# Patient Record
Sex: Male | Born: 1970 | Race: Black or African American | Hispanic: No | Marital: Married | Smoking: Current some day smoker
Health system: Southern US, Community
[De-identification: ages and names within clinical notes are randomized; demographics above are authoritative.]

## PROBLEM LIST (undated history)

## (undated) DIAGNOSIS — K649 Unspecified hemorrhoids: Secondary | ICD-10-CM

## (undated) DIAGNOSIS — F419 Anxiety disorder, unspecified: Secondary | ICD-10-CM

## (undated) DIAGNOSIS — K219 Gastro-esophageal reflux disease without esophagitis: Secondary | ICD-10-CM

## (undated) DIAGNOSIS — K5900 Constipation, unspecified: Secondary | ICD-10-CM

## (undated) HISTORY — PX: OTHER SURGICAL HISTORY: SHX169

## (undated) HISTORY — DX: Constipation, unspecified: K59.00

---

## 2006-03-06 ENCOUNTER — Encounter: Admission: RE | Admit: 2006-03-06 | Discharge: 2006-03-06 | Payer: Self-pay | Admitting: Occupational Medicine

## 2007-12-07 ENCOUNTER — Ambulatory Visit (HOSPITAL_COMMUNITY): Admission: RE | Admit: 2007-12-07 | Discharge: 2007-12-07 | Payer: Self-pay | Admitting: Internal Medicine

## 2013-07-09 ENCOUNTER — Ambulatory Visit: Payer: Self-pay | Admitting: General Practice

## 2013-07-10 ENCOUNTER — Ambulatory Visit (INDEPENDENT_AMBULATORY_CARE_PROVIDER_SITE_OTHER): Payer: BC Managed Care – PPO | Admitting: Nurse Practitioner

## 2013-07-10 ENCOUNTER — Encounter: Payer: Self-pay | Admitting: Nurse Practitioner

## 2013-07-10 VITALS — BP 137/75 | HR 84 | Temp 97.6°F | Ht 72.0 in | Wt 197.0 lb

## 2013-07-10 DIAGNOSIS — M7912 Myalgia of auxiliary muscles, head and neck: Secondary | ICD-10-CM

## 2013-07-10 DIAGNOSIS — IMO0001 Reserved for inherently not codable concepts without codable children: Secondary | ICD-10-CM

## 2013-07-10 MED ORDER — NAPROXEN 500 MG PO TABS: 500.0000 mg | ORAL_TABLET | Freq: Two times a day (BID) | ORAL | Status: DC

## 2013-07-10 NOTE — Patient Instructions (Signed)

## 2013-07-10 NOTE — Progress Notes (Signed)
   Subjective:    Patient ID: Glenn Coleman, male    DOB: [birth year], 43 y.o.   MRN: [id number]  Shoulder Pain  The pain is present in the left shoulder. This is a new problem. The current episode started 1 to 4 weeks ago. There has been no history of extremity trauma. The problem occurs intermittently. The problem has been unchanged. The quality of the pain is described as dull. The pain is at a severity of 6/10. The pain is moderate. Pertinent negatives include no fever, inability to bear weight, itching, joint locking, joint swelling, limited range of motion, numbness, stiffness or tingling. The symptoms are aggravated by activity (movement makes it worse). He has tried NSAIDS and acetaminophen for the symptoms. The treatment provided mild relief. Family history does not include gout or rheumatoid arthritis.      Review of Systems  Constitutional: Negative for fever.  Musculoskeletal: Negative for stiffness.  Skin: Negative for itching.  Neurological: Negative for tingling and numbness.  All other systems reviewed and are negative.       Objective:   Physical Exam  Constitutional: He appears well-developed and well-nourished.  Cardiovascular: Normal rate, regular rhythm and normal heart sounds.   Pulmonary/Chest: Effort normal and breath sounds normal.  Musculoskeletal:  Decreased ROM of cervical spine due to pain on extension- no pain on rotation Point tenderness along sternocleidomastoid muscle FROM of left shoulder without pain Grips equal bil DTR's equal bil   Neurological: He has normal reflexes.  Psychiatric: He has a normal mood and affect. His behavior is normal. Judgment and thought content normal.    BP 137/75  Pulse 84  Temp(Src) 97.6 F (36.4 C) (Oral)  Ht 6' (1.829 m)  Wt 197 lb (89.359 kg)  BMI 26.71 kg/m2       Assessment & Plan:   1. Sternocleidomastoid muscle tenderness    Meds ordered this encounter  Medications  . naproxen (NAPROSYN) 500 MG  tablet    Sig: Take 1 tablet (500 mg total) by mouth 2 (two) times daily with a meal.    Dispense:  60 tablet    Refill:  0    Order Specific Question:  Supervising Provider    Answer:  Chipper Herb [[id number]]   Moist heat rst No heavy lifting RTO prn  Mary-Margaret Hassell Done, FNP

## 2013-10-17 ENCOUNTER — Telehealth: Payer: Self-pay | Admitting: Nurse Practitioner

## 2013-10-17 NOTE — Telephone Encounter (Signed)
appt made for friday

## 2013-10-18 ENCOUNTER — Ambulatory Visit (INDEPENDENT_AMBULATORY_CARE_PROVIDER_SITE_OTHER): Payer: BC Managed Care – PPO | Admitting: Family Medicine

## 2013-10-18 ENCOUNTER — Encounter: Payer: Self-pay | Admitting: Family Medicine

## 2013-10-18 VITALS — BP 129/77 | HR 88 | Temp 98.9°F | Ht 72.0 in | Wt 199.2 lb

## 2013-10-18 DIAGNOSIS — K5900 Constipation, unspecified: Secondary | ICD-10-CM

## 2013-10-18 DIAGNOSIS — K649 Unspecified hemorrhoids: Secondary | ICD-10-CM

## 2013-10-18 MED ORDER — HYDROCORTISONE ACETATE 25 MG RE SUPP: 25.0000 mg | Freq: Two times a day (BID) | RECTAL | Status: AC

## 2013-10-18 NOTE — Progress Notes (Signed)
   Subjective:    Patient ID: KENDRIK MCSHAN, male    DOB: 1971-01-16, 43 y.o.   MRN: [id number]  HPI This 43 y.o. male presents for evaluation of constipation and blood on stool for a day. C/o rectal pruritis  Review of Systems C/o rectal pruritis and constipation   No chest pain, SOB, HA, dizziness, vision change, N/V, diarrhea, constipation, dysuria, urinary urgency or frequency, myalgias, arthralgias or rash.  Objective:   Physical Exam Vital signs noted  Well developed well nourished male.  HEENT - Head atraumatic Normocephalic                Eyes - PERRLA, Conjuctiva - clear Sclera- Clear EOMI Respiratory - Lungs CTA bilateral Cardiac - RRR S1 and S2 without murmur GI - Abdomen soft Nontender and bowel sounds active x 4 Extremities - No edema. Neuro - Grossly intact.       Assessment & Plan:  Unspecified constipation - Plan: hydrocortisone (ANUSOL-HC) 25 MG suppository  Hemorrhoids - Plan: hydrocortisone (ANUSOL-HC) 25 MG suppository  Discussed getting plenty of fluid and fiber in diet  Lysbeth Penner FNP

## 2013-10-24 ENCOUNTER — Ambulatory Visit (INDEPENDENT_AMBULATORY_CARE_PROVIDER_SITE_OTHER): Payer: BC Managed Care – PPO | Admitting: Nurse Practitioner

## 2013-10-24 VITALS — BP 135/75 | HR 71 | Temp 98.7°F | Ht 72.0 in | Wt 200.0 lb

## 2013-10-24 DIAGNOSIS — K5900 Constipation, unspecified: Secondary | ICD-10-CM

## 2013-10-24 DIAGNOSIS — K625 Hemorrhage of anus and rectum: Secondary | ICD-10-CM

## 2013-10-24 DIAGNOSIS — K644 Residual hemorrhoidal skin tags: Secondary | ICD-10-CM

## 2013-10-24 NOTE — Progress Notes (Signed)
   Subjective:    Patient ID: Glenn Coleman, male    DOB: 1971/04/21, 43 y.o.   MRN: [id number]  HPI Patient was seen Friday in office with c/o constipation- was given anusol-HC- Patient used them but is now having bright red blood. Denies rectal pain.    Review of Systems  Constitutional: Negative.   HENT: Negative.   Respiratory: Negative.   Cardiovascular: Negative.   Genitourinary: Negative.   Psychiatric/Behavioral: Negative.   All other systems reviewed and are negative.      Objective:   Physical Exam  Constitutional: He is oriented to person, place, and time. He appears well-developed and well-nourished.  Cardiovascular: Normal rate, regular rhythm and normal heart sounds.   Pulmonary/Chest: Effort normal and breath sounds normal.  Abdominal: Soft. Bowel sounds are normal.  Genitourinary:  Rectal exam revealed a mildly thrombosed external hemorrhoid. No internal hemorrhoids palpable.  Neurological: He is alert and oriented to person, place, and time.  Skin: Skin is warm and dry.  Psychiatric: He has a normal mood and affect. His behavior is normal. Judgment and thought content normal.  BP 135/75  Pulse 71  Temp(Src) 98.7 F (37.1 C) (Oral)  Ht 6' (1.829 m)  Wt 200 lb (90.719 kg)  BMI 27.12 kg/m2         Assessment & Plan:  1. Rectal bleeding Keep watch  2. External bleeding hemorrhoids Use anusol HC supp as rx  3. Constipation Increase fiber in diet Drink lots of liquids miralax OTC as needed  [place], FNP

## 2013-10-24 NOTE — Patient Instructions (Signed)

## 2014-10-04 ENCOUNTER — Encounter (HOSPITAL_COMMUNITY): Payer: Self-pay | Admitting: Emergency Medicine

## 2014-10-04 ENCOUNTER — Emergency Department (HOSPITAL_COMMUNITY)
Admission: EM | Admit: 2014-10-04 | Discharge: 2014-10-04 | Disposition: A | Discharge status: Home / Self Care | Payer: BLUE CROSS/BLUE SHIELD | Attending: Emergency Medicine | Admitting: Emergency Medicine

## 2014-10-04 DIAGNOSIS — Z7952 Long term (current) use of systemic steroids: Secondary | ICD-10-CM | POA: Insufficient documentation

## 2014-10-04 DIAGNOSIS — Z720 Tobacco use: Secondary | ICD-10-CM | POA: Diagnosis not present

## 2014-10-04 DIAGNOSIS — K5900 Constipation, unspecified: Secondary | ICD-10-CM | POA: Diagnosis not present

## 2014-10-04 DIAGNOSIS — K921 Melena: Secondary | ICD-10-CM | POA: Insufficient documentation

## 2014-10-04 DIAGNOSIS — K625 Hemorrhage of anus and rectum: Secondary | ICD-10-CM | POA: Diagnosis present

## 2014-10-04 DIAGNOSIS — K648 Other hemorrhoids: Secondary | ICD-10-CM | POA: Insufficient documentation

## 2014-10-04 HISTORY — DX: Unspecified hemorrhoids: K64.9

## 2014-10-04 LAB — CBC
HCT: 45.5 % (ref 39.0–52.0)
Hemoglobin: 15.3 g/dL (ref 13.0–17.0)
MCH: 27.9 pg (ref 26.0–34.0)
MCHC: 33.6 g/dL (ref 30.0–36.0)
MCV: 82.9 fL (ref 78.0–100.0)
PLATELETS: 233 10*3/uL (ref 150–400)
RBC: 5.49 MIL/uL (ref 4.22–5.81)
RDW: 14.4 % (ref 11.5–15.5)
WBC: 7.7 10*3/uL (ref 4.0–10.5)

## 2014-10-04 LAB — COMPREHENSIVE METABOLIC PANEL
ALBUMIN: 4.2 g/dL (ref 3.5–5.2)
ALT: 28 U/L (ref 0–53)
AST: 26 U/L (ref 0–37)
Alkaline Phosphatase: 62 U/L (ref 39–117)
Anion gap: 9 (ref 5–15)
BUN: 16 mg/dL (ref 6–23)
CALCIUM: 9.2 mg/dL (ref 8.4–10.5)
CO2: 25 mmol/L (ref 19–32)
Chloride: 106 mmol/L (ref 96–112)
Creatinine, Ser: 1.15 mg/dL (ref 0.50–1.35)
GFR calc Af Amer: 89 mL/min — ABNORMAL LOW (ref >90)
GFR, EST NON AFRICAN AMERICAN: 77 mL/min — AB (ref >90)
GLUCOSE: 132 mg/dL — AB (ref 70–99)
Potassium: 3.7 mmol/L (ref 3.5–5.1)
Sodium: 140 mmol/L (ref 135–145)
Total Bilirubin: 0.6 mg/dL (ref 0.3–1.2)
Total Protein: 8.1 g/dL (ref 6.0–8.3)

## 2014-10-04 LAB — POC OCCULT BLOOD, ED: FECAL OCCULT BLD: POSITIVE — AB

## 2014-10-04 NOTE — Discharge Instructions (Signed)
Follow up with dr. Sydell Axon in 1-2 weeks return if problems

## 2014-10-04 NOTE — ED Notes (Signed)
Pt reports bright red rectal bleeding for last several days. Pt denies any dizziness,abd pain or weakness.pt reports history of hemorrhoids and constipation. Pt reports has been using rectal suppositories at home. Last NBM x1 week.

## 2014-10-04 NOTE — ED Provider Notes (Signed)
CSN: [id number]     Arrival date & time 10/04/14  1937 History  This chart was scribed for Milton Ferguson, MD by Erling Conte, ED Scribe. This patient was seen in room APA08/AP[facility]08 and the patient's care was started at 9:15 AM.    Chief Complaint  Patient presents with  . Rectal Bleeding    Patient is a 44 y.o. male presenting with hematochezia. The history is provided by the patient. No language interpreter was used.  Rectal Bleeding Quality:  Bright red Amount:  Moderate Duration:  2 days Timing:  Intermittent Progression:  Unchanged Chronicity:  Recurrent Context: constipation and hemorrhoids   Similar prior episodes: yes   Relieved by:  Nothing Ineffective treatments: suppositories. Associated symptoms: no abdominal pain, no fever and no vomiting      HPI Comments: CHRLES SELLEY is a 44 y.o. male who presents to the Emergency Department complaining of bright red rectal bleeding that began 2 days ago.Pt has a h/o hemorrhoids and has had this issue in the past. Pt notes before this recent episode of hematochezia he had been having mild constipation. He has been using hemorrhoid suppositories with minimal relief. He has not followed up with a GI specialist for this issue. He denies any rectal pain, fever, chills,nausea, vomiting, or abdominal pain.  Past Medical History  Diagnosis Date  . Hemorrhoids    History reviewed. No pertinent past surgical history. History reviewed. No pertinent family history. History  Substance Use Topics  . Smoking status: Heavy Tobacco Smoker -- 0.00 packs/day  . Smokeless tobacco: Not on file  . Alcohol Use: Yes     Comment: occasional    Review of Systems  Constitutional: Negative for fever, appetite change and fatigue.  HENT: Negative for congestion, ear discharge and sinus pressure.   Eyes: Negative for discharge.  Respiratory: Negative for cough.   Cardiovascular: Negative for chest pain.  Gastrointestinal: Positive for constipation  (prior to episode), blood in stool and hematochezia. Negative for vomiting, abdominal pain, diarrhea and rectal pain.  Genitourinary: Negative for frequency and hematuria.  Musculoskeletal: Negative for back pain.  Skin: Negative for rash.  Neurological: Negative for seizures and headaches.  Psychiatric/Behavioral: Negative for hallucinations.      Allergies  Review of patient's allergies indicates no known allergies.  Home Medications   Prior to Admission medications   Medication Sig Start Date End Date Taking? Authorizing Provider  hydrocortisone (ANUSOL-HC) 25 MG suppository Place 1 suppository (25 mg total) rectally 2 (two) times daily. 10/18/13   Lysbeth Penner, FNP   Triage Vitals: BP 155/96 mmHg  Pulse 105  Temp(Src) 98.4 F (36.9 C) (Oral)  Resp 18  Ht 6' (1.829 m)  Wt 200 lb (90.719 kg)  BMI 27.12 kg/m2  SpO2 98%   Physical Exam  Constitutional: He is oriented to person, place, and time. He appears well-developed.  HENT:  Head: Normocephalic.  Eyes: Conjunctivae and EOM are normal. No scleral icterus.  Neck: Neck supple. No thyromegaly present.  Cardiovascular: Normal rate and regular rhythm.  Exam reveals no gallop and no friction rub.   No murmur heard. Pulmonary/Chest: No stridor. He has no wheezes. He has no rales. He exhibits no tenderness.  Abdominal: He exhibits no distension. There is no tenderness. There is no rebound.  Genitourinary:  1 external hemorrhoid, non thrombosed. Otherwise normal rectal exam. Heme positive  Musculoskeletal: Normal range of motion. He exhibits no edema.  Lymphadenopathy:    He has no cervical adenopathy.  Neurological:  He is oriented to person, place, and time. He exhibits normal muscle tone. Coordination normal.  Skin: No rash noted. No erythema.  Psychiatric: He has a normal mood and affect. His behavior is normal.    ED Course  Procedures (including critical care time)  DIAGNOSTIC STUDIES: Oxygen Saturation is 98%  on RA, normal by my interpretation.    COORDINATION OF CARE: 9:23 AM- Will order CBC, CMP, and orthostatic vital signs.  Pt advised of plan for treatment and pt agrees.   Labs Review Labs Reviewed  POC OCCULT BLOOD, ED - Abnormal; Notable for the following:    Fecal Occult Bld POSITIVE (*)    All other components within normal limits  CBC  COMPREHENSIVE METABOLIC PANEL    Imaging Review No results found.   EKG Interpretation None      MDM   Final diagnoses:  None    External hemorrhoid.  Follow up with gi   The chart was scribed for me under my direct supervision.  I personally performed the history, physical, and medical decision making and all procedures in the evaluation of this patient.Milton Ferguson, MD 10/04/14 [phone]

## 2014-10-04 NOTE — ED Notes (Signed)
Hemoccult performed by MD.  Positive results.

## 2014-10-09 ENCOUNTER — Encounter: Payer: Self-pay | Admitting: Gastroenterology

## 2014-10-15 ENCOUNTER — Ambulatory Visit: Payer: BLUE CROSS/BLUE SHIELD | Admitting: Nurse Practitioner

## 2014-10-15 ENCOUNTER — Encounter: Payer: Self-pay | Admitting: Nurse Practitioner

## 2014-10-15 ENCOUNTER — Ambulatory Visit (INDEPENDENT_AMBULATORY_CARE_PROVIDER_SITE_OTHER): Payer: BLUE CROSS/BLUE SHIELD | Admitting: Nurse Practitioner

## 2014-10-15 VITALS — BP 134/79 | HR 90 | Temp 97.8°F | Ht 72.0 in | Wt 200.0 lb

## 2014-10-15 DIAGNOSIS — K649 Unspecified hemorrhoids: Secondary | ICD-10-CM | POA: Diagnosis not present

## 2014-10-15 NOTE — Progress Notes (Signed)
Primary Care Physician:  Glenda Chroman., MD Primary Gastroenterologist:  Dr.Fields   Chief Complaint  Patient presents with  . Hemorrhoids    HPI:   44 year old male presents on referral from PCP for hemorrhoids. Per the PCP note which was reviewed the patient presents to any pain emergency room for rectal pain and bleeding. Diagnosis was hemorrhoids, continued having bleeding for 2 days after his visit to emergency room. Per her PCP had been noted constipated for few days after his initial bowel movement had significant blood in the stool and continue with intermittent symptoms. External hemorrhoid was noted on exam with mild blood noted on the hemorrhoid no blood in the rectal vault or any black tarry stools. Was given a rectal suppository for hemorrhoids symptomatic treatment and referred to GI. In the emergency room his CBC and CMP were normal with a hemoglobin of 15.9.  Today he states he went to the ER for rectal bleeding after a night of drinking. In the ER he was told he had hemorrhoids. Saw his PCP a couple days later who Rx stool softener and rectal cream, which the pharmacy was unable to fill for a few days. When he did start using thecream his symptoms resolved after a couple days. His symptoms have been resolved for about a week. Denies any more rectal pain, itching, irritation, bleeding. Deneis melena. Denies abdominal pain, N/V. Hasn't taken stool softener in a couple days. Stools remain soft with no needed straining. Denies any other upper or lower GI symptoms.  Past Medical History  Diagnosis Date  . Hemorrhoids     No past surgical history on file.  Current Outpatient Prescriptions  Medication Sig Dispense Refill  . docusate sodium (COLACE) 100 MG capsule Take 100 mg by mouth 2 (two) times daily.    . hydrocortisone (ANUSOL-HC) 25 MG suppository Place 1 suppository (25 mg total) rectally 2 (two) times daily. (Patient not taking: Reported on 10/15/2014) 12 suppository 1    No current facility-administered medications for this visit.    Allergies as of 10/15/2014  . (No Known Allergies)    No family history on file.  History   Social History  . Marital Status: Married    Spouse Name: N/A  . Number of Children: N/A  . Years of Education: N/A   Occupational History  . Not on file.   Social History Main Topics  . Smoking status: Heavy Tobacco Smoker -- 0.00 packs/day  . Smokeless tobacco: Not on file  . Alcohol Use: Yes     Comment: occasional  . Drug Use: No  . Sexual Activity: Not on file   Other Topics Concern  . Not on file   Social History Narrative    Review of Systems: General: Negative for anorexia, weight loss, fever, chills, fatigue, weakness. Eyes: Negative for vision changes.  ENT: Negative for hoarseness, difficulty swallowing. CV: Negative for chest pain, angina, palpitations,  peripheral edema.  Respiratory: Negative for dyspnea at rest, cough, wheezing.  GI: See history of present illness. Derm: Negative for rash or itching.  Neuro: Negative for weakness, seizure, memory loss, confusion.  Psych: Negative for anxiety, depression.  Endo: Negative for unusual weight change.  Heme: Negative for bruising or bleeding. Allergy: Negative for rash or hives.    Physical Exam: BP 134/79 mmHg  Pulse 90  Temp(Src) 97.8 F (36.6 C) (Oral)  Ht 6' (1.829 m)  Wt 200 lb (90.719 kg)  BMI 27.12 kg/m2 General:   Alert and oriented.  Pleasant and cooperative. Well-nourished and well-developed.  Head:  Normocephalic and atraumatic. Eyes:  Without icterus, sclera clear and conjunctiva pink.  Ears:  Normal auditory acuity. Lungs:  Clear to auscultation bilaterally. No wheezes, rales, or rhonchi. No distress.  Heart:  S1, S2 present without murmurs appreciated.  Abdomen:  +BS, soft, non-tender and non-distended. No HSM noted. No guarding or rebound. No masses appreciated.  Rectal:  Deferred  Extremities:  Without clubbing or  edema. Neurologic:  Alert and  oriented x4;  grossly normal neurologically. Skin:  Intact without significant lesions or rashes. Cervical Nodes:  No significant cervical adenopathy. Psych:  Alert and cooperative. Normal mood and affect.     10/15/2014 9:02 AM

## 2014-10-15 NOTE — Assessment & Plan Note (Signed)
44 year old male referred for hemorrhoids and rectal bleeding. His PCP started him on a stool softener as well as Anusol rectal cream. His symptoms have since resolved and has not had any bleeding pain or irritation for over a week. At this point we'll have him return in 3 months once everything is calm down in order to reevaluate his symptoms determine has been any additional bleeding, and check an I follow-up for any blood. Patient has also been explained that based on his risk profile he should have his first screening colonoscopy in 2019 at age 29 because he is African-American. He is agreeable to this and we will place him on the recall list for it. He can also call us before then if he has additional flares of his hemorrhoid symptoms or rectal bleeding for either further prescriptions or to be seen sooner.

## 2014-10-15 NOTE — Patient Instructions (Signed)
1. Call if you have another flare up of your hemorrhoids 2. Return in 3 months so we can check a stool test for any more blood. 3. Return sooner if needed.

## 2014-10-15 NOTE — Progress Notes (Signed)
cc'ed to pcp °

## 2015-01-14 ENCOUNTER — Ambulatory Visit (INDEPENDENT_AMBULATORY_CARE_PROVIDER_SITE_OTHER): Payer: BLUE CROSS/BLUE SHIELD | Admitting: Nurse Practitioner

## 2015-01-14 ENCOUNTER — Encounter: Payer: Self-pay | Admitting: Nurse Practitioner

## 2015-01-14 VITALS — BP 134/79 | HR 86 | Temp 97.7°F | Ht 72.0 in | Wt 199.0 lb

## 2015-01-14 DIAGNOSIS — K5900 Constipation, unspecified: Secondary | ICD-10-CM

## 2015-01-14 DIAGNOSIS — K649 Unspecified hemorrhoids: Secondary | ICD-10-CM

## 2015-01-14 NOTE — Assessment & Plan Note (Signed)
44 year old male currently asymptomatic with his hemorrhoids. He has not noted any additional rectal bleeding. His single episode of rectal bleeding likely related to constipation induced hemorrhoid irritation which is subsequently resolved with Anusol rectal cream. Return for follow-up as needed for any worsening or recurrent symptoms.

## 2015-01-14 NOTE — Progress Notes (Signed)
    Referring Provider: Glenda Chroman., MD Primary Care Physician:  Glenda Chroman., MD Primary GI:  Dr. Oneida Alar  Chief Complaint  Patient presents with  . Follow-up    HPI:   44 year old male presents for follow-up on hemorrhoids and rectal bleeding. He was evaluated in the emergency room before his previous visit at which point to start him on Anusol rectal cream. At his last visit his symptoms had resolved for about a week. We advised him to continue his therapies and return in 3 months to evaluate longer-term effect of treatment. He is due for his first colonoscopy in 2019 and is on the recall list.  Today he states he's doing pretty good. Has not noted any additional rectal bleeding. Has improved bowel movement with increased fiber and water. Does occasionally have a feeling of constipation and needing to strain which occurs about 1-2 times a week but is better with increased fiber and diet control. Denies rectal pain, irritation, and itching. If he does have symptoms he will use Anusol cream. Denies abdominal pain. Denies fever, chills, unintentional weight loss. Denies chest pain, dyspnea, dizziness, lightheadedness, syncope, near syncope. Denies any other upper or lower GI symptoms.  Past Medical History  Diagnosis Date  . Hemorrhoids     Past Surgical History  Procedure Laterality Date  . None to date - 10/15/14      Current Outpatient Prescriptions  Medication Sig Dispense Refill  . docusate sodium (COLACE) 100 MG capsule Take 100 mg by mouth 2 (two) times daily.    . hydrocortisone (ANUSOL-HC) 25 MG suppository Place 1 suppository (25 mg total) rectally 2 (two) times daily. 12 suppository 1   No current facility-administered medications for this visit.    Allergies as of 01/14/2015  . (No Known Allergies)    Family History  Problem Relation Age of Onset  . Colon cancer Neg Hx     History   Social History  . Marital Status: Married    Spouse Name: N/A  . Number  of Children: N/A  . Years of Education: N/A   Social History Main Topics  . Smoking status: Current Some Day Smoker -- 0.00 packs/day    Types: Cigars  . Smokeless tobacco: Not on file     Comment: Occasional cigar  . Alcohol Use: 0.0 oz/week    0 Standard drinks or equivalent per week     Comment: Every weekend, about 3-5 drinks  . Drug Use: No  . Sexual Activity: Not on file   Other Topics Concern  . None   Social History Narrative    Review of Systems: 10 point ROS negative except as per HPI.   Physical Exam: BP 134/79 mmHg  Pulse 86  Temp(Src) 97.7 F (36.5 C)  Ht 6' (1.829 m)  Wt 199 lb (90.266 kg)  BMI 26.98 kg/m2 General:   Alert and oriented. Pleasant and cooperative. Well-nourished and well-developed.  Head:  Normocephalic and atraumatic. Cardiovascular:  S1, S2 present without murmurs appreciated. Normal pulses noted. Extremities without clubbing or edema. Respiratory:  Clear to auscultation bilaterally. No wheezes, rales, or rhonchi. No distress.  Gastrointestinal:  +BS, soft, non-tender and non-distended. No HSM noted. No guarding or rebound. No masses appreciated.  Neurologic:  Alert and oriented x4;  grossly normal neurologically. Psych:  Alert and cooperative. Normal mood and affect.    01/14/2015 2:10 PM

## 2015-01-14 NOTE — Progress Notes (Signed)
CC'ED TO PCP 

## 2015-01-14 NOTE — Assessment & Plan Note (Signed)
44 year old male with symptoms of constipation. He states his symptoms are improved with increased fiber intake, fruits, and vegetables. However he still has one to 2 episodes a week of difficulty emptying his bowels with increased straining required. No rectal bleeding noted. No red flag/warning signs or symptoms. At this point we will advise him to start Colace stool softener once daily as well as Benefiber fiber supplementation daily. Return as needed for any recurrent or worsening symptoms.

## 2015-01-14 NOTE — Patient Instructions (Signed)
1. Chart taking a daily stool softener such as Colace once a day. 2. You can also add fiber supplement to your diet such as Benefiber 2 teaspoons once daily. 3. Return for further evaluation if any worsening symptoms, recurrent rectal bleeding. 4. We have you on a recall list for your first do screening colonoscopy and we will send you a letter when it is time to have that done.

## 2015-01-15 NOTE — Progress Notes (Signed)
CC'ED TO PCP 

## 2015-04-23 ENCOUNTER — Ambulatory Visit (INDEPENDENT_AMBULATORY_CARE_PROVIDER_SITE_OTHER): Payer: BLUE CROSS/BLUE SHIELD | Admitting: *Deleted

## 2015-04-23 DIAGNOSIS — Z23 Encounter for immunization: Secondary | ICD-10-CM

## 2017-08-17 ENCOUNTER — Encounter: Payer: Self-pay | Admitting: Gastroenterology

## 2017-08-31 ENCOUNTER — Ambulatory Visit: Payer: BLUE CROSS/BLUE SHIELD | Admitting: Nurse Practitioner

## 2017-08-31 ENCOUNTER — Other Ambulatory Visit: Payer: Self-pay

## 2017-08-31 ENCOUNTER — Encounter: Payer: Self-pay | Admitting: Nurse Practitioner

## 2017-08-31 ENCOUNTER — Telehealth: Payer: Self-pay

## 2017-08-31 VITALS — BP 144/84 | HR 79 | Temp 98.5°F | Ht 72.0 in | Wt 190.6 lb

## 2017-08-31 DIAGNOSIS — K649 Unspecified hemorrhoids: Secondary | ICD-10-CM | POA: Diagnosis not present

## 2017-08-31 DIAGNOSIS — K625 Hemorrhage of anus and rectum: Secondary | ICD-10-CM | POA: Diagnosis not present

## 2017-08-31 DIAGNOSIS — K5900 Constipation, unspecified: Secondary | ICD-10-CM

## 2017-08-31 MED ORDER — PEG 3350-KCL-NA BICARB-NACL 420 G PO SOLR: 4000.0000 mL | ORAL | 0 refills | Status: DC

## 2017-08-31 NOTE — Telephone Encounter (Signed)
Called and informed pt of pre-op appt 10/17/17 at 9:00am. Letter mailed.

## 2017-08-31 NOTE — Progress Notes (Addendum)
REVIEWED-NO ADDITIONAL RECOMMENDATIONS.  Referring Provider: Glenda Chroman, MD Primary Care Physician:  Glenda Chroman, MD Primary GI:  Dr. Oneida Alar  Chief Complaint  Patient presents with  . Hemorrhoids    itching/burning/some bright red blood with BM    HPI:   Glenn Coleman is a 47 y.o. male who presents for follow-up on hemorrhoids.  The patient was last seen in our office 01/14/2015 also for hemorrhoids and constipation.  Last visit he was doing well, no additional rectal bleeding and improved bowel movement with increased fiber and water.  Occasional feeling of constipation and straining about 1-2 times a week but is improved with increased fiber in diet  Rare hemorrhoid symptoms which improved with Anusol.  No other GI symptoms.  Noted be due for first-ever colonoscopy in 2019 and is on the recall list.  Recommended daily stool softener such as Colace, add fiber supplement, return for any worsening symptoms.  Recently saw primary care 08/16/2017 for recurrent hemorrhoids.  At that time he noted he felt he was seeing a non-constipating diet, worried it could be "something else".  Symptoms included anal pain and itching, constipation.  Onset was 2 weeks ago.  Today he states his hemorrhoids have been bothering him intermittently "for some time." Still has Anusol and using that which helps some. Started seeing rectal bleeding over the last year. Most recently had bleeding yesterday with dripping blood, blood on the stool and in the commode; bleeding intermittent. Has straining and feels like it's the hemorrhoids; stools are hard, however. Taking stool softeners usually help. Takes stool softener once daily. Denies abdominal pain, N/V, melena, fever, chills, unintentional weight loss. Denies chest pain, dyspnea, dizziness, lightheadedness, syncope, near syncope. Denies any other upper or lower GI symptoms.  Past Medical History:  Diagnosis Date  . Constipation   . Hemorrhoids     Past  Surgical History:  Procedure Laterality Date  . None to date     As of 08/31/17    Current Outpatient Medications  Medication Sig Dispense Refill  . docusate sodium (COLACE) 100 MG capsule Take 100 mg by mouth 2 (two) times daily.    . hydrocortisone (ANUSOL-HC) 25 MG suppository Place 1 suppository (25 mg total) rectally 2 (two) times daily. 12 suppository 1   No current facility-administered medications for this visit.     Allergies as of 08/31/2017  . (No Known Allergies)    Family History  Problem Relation Age of Onset  . Colon cancer Neg Hx     Social History   Socioeconomic History  . Marital status: Married    Spouse name: None  . Number of children: None  . Years of education: None  . Highest education level: None  Social Needs  . Financial resource strain: None  . Food insecurity - worry: None  . Food insecurity - inability: None  . Transportation needs - medical: None  . Transportation needs - non-medical: None  Occupational History  . None  Tobacco Use  . Smoking status: Current Some Day Smoker    Packs/day: 0.00    Types: Cigars  . Smokeless tobacco: Never Used  . Tobacco comment: 1 cigar  Substance and Sexual Activity  . Alcohol use: Yes    Alcohol/week: 0.0 oz    Comment: Every weekend, about 3-5 drinks  . Drug use: No  . Sexual activity: None  Other Topics Concern  . None  Social History Narrative  . None    Review of  Systems: Complete ROS negative except as per HPI.   Physical Exam: BP (!) 144/84   Pulse 79   Temp 98.5 F (36.9 C) (Oral)   Ht 6' (1.829 m)   Wt 190 lb 9.6 oz (86.5 kg)   BMI 25.85 kg/m  General:   Alert and oriented. Pleasant and cooperative. Well-nourished and well-developed.  Head:  Normocephalic and atraumatic. Eyes:  Without icterus, sclera clear and conjunctiva pink.  Ears:  Normal auditory acuity. Cardiovascular:  S1, S2 present without murmurs appreciated. Extremities without clubbing or edema. Respiratory:   Clear to auscultation bilaterally. No wheezes, rales, or rhonchi. No distress.  Gastrointestinal:  +BS, soft, non-tender and non-distended. No HSM noted. No guarding or rebound. No masses appreciated.  Rectal:  Deferred  Musculoskalatal:  Symmetrical without gross deformities. Neurologic:  Alert and oriented x4;  grossly normal neurologically. Psych:  Alert and cooperative. Normal mood and affect. Heme/Lymph/Immune: No excessive bruising noted.    08/31/2017 10:10 AM   Disclaimer: This note was dictated with voice recognition software. Similar sounding words can inadvertently be transcribed and may not be corrected upon review.

## 2017-08-31 NOTE — Assessment & Plan Note (Addendum)
History of intermittent rectal bleeding due to hemorrhoids.  His bleeding stopped in 2016.  He states that it started recurring within the past year.  Known history of hemorrhoids with hemorrhoid symptoms as per below.  He is African-American 47 year old male and would be due for first-ever colonoscopy this year.  Rectal bleeding is intermittent, occasionally moderate to significant amount.  No symptoms of overt anemia.  At this point we will proceed with a colonoscopy to further evaluate.  He is also interested and possible hemorrhoid banding, if he is a candidate.  Follow-up in 3 months.  Proceed with colonoscopy +/- hemorrhoid banding with Dr. Oneida Alar in the near future. The risks, benefits, and alternatives have been discussed in detail with the patient. They state understanding and desire to proceed.   The patient is not on any anticoagulants, anxiolytics, chronic pain medications, or antidepressants.  Patient is large in stature/muscular.  He drinks 3-4 alcoholic drinks on the weekends.  Given chronic alcohol use, relatively young age, large stature we will plan for propofol/MAC to promote adequate sedation.

## 2017-08-31 NOTE — Assessment & Plan Note (Addendum)
History of known hemorrhoids which are symptomatic for him including irritation, rectal fullness, pain.  Also with rectal bleeding.  He is due for colonoscopy and we will proceed as per below.  Continue Anusol.  At this point recommend trying to control constipation as much as possible with his hemorrhoids.  Increase water intake, continue Colace.  Add MiraLAX 1-2 times a day as needed for softer stools.  Follow-up in 3 months.

## 2017-08-31 NOTE — Assessment & Plan Note (Signed)
Constipation likely worsening his hemorrhoid symptoms.  Continue Colace, increase water intake, MiraLAX 1-2 times a day as needed.  Follow-up in 3 months.  Consider hemorrhoid banding, per patient request.

## 2017-08-31 NOTE — Progress Notes (Signed)
cc'ed to pcp °

## 2017-08-31 NOTE — Patient Instructions (Signed)
1. Continue taking Anusol for your hemorrhoids. 2. Continue taking Colace daily. 3. Increase the amount of water you drink daily. 4. You can use MiraLAX 1-2 times a day.  Mix 1 dose (1 capful) and a beverage of your choice.  Do this to help with softer stools, as needed. 5. We will schedule your colonoscopy for you. 6. Follow-up in 3 months. 7. Call us if you have any questions or concerns.

## 2017-09-04 ENCOUNTER — Telehealth: Payer: Self-pay

## 2017-09-04 NOTE — Telephone Encounter (Signed)
Called [employer]. No PA needed for TCS/-/+hem banding. Ref# [id number].

## 2017-09-21 ENCOUNTER — Ambulatory Visit: Payer: BLUE CROSS/BLUE SHIELD | Admitting: Gastroenterology

## 2017-10-12 NOTE — Patient Instructions (Signed)
Glenn Coleman  10/12/2017     @PREFPERIOPPHARMACY @   Your procedure is scheduled on  10/24/2017   Report to [facility] at  1145   A.M.  Call this number if you have problems the morning of surgery:  [phone]   Remember:  Do not eat food or drink liquids after midnight.  Take these medicines the morning of surgery with A SIP OF WATER  nexium.   Do not wear jewelry, make-up or nail polish.  Do not wear lotions, powders, or perfumes, or deodorant.  Do not shave 48 hours prior to surgery.  Men may shave face and neck.  Do not bring valuables to the hospital.  [facility] is not responsible for any belongings or valuables.  Contacts, dentures or bridgework may not be worn into surgery.  Leave your suitcase in the car.  After surgery it may be brought to your room.  For patients admitted to the hospital, discharge time will be determined by your treatment team.  Patients discharged the day of surgery will not be allowed to drive home.   Name and phone number of your driver:   family Special instructions:  Follow the diet and prep instructions given to you by Dr Nona Dell office.  Please read over the following fact sheets that you were given. Anesthesia Post-op Instructions and Care and Recovery After Surgery       Colonoscopy, Adult A colonoscopy is an exam to look at the large intestine. It is done to check for problems, such as:  Lumps (tumors).  Growths (polyps).  Swelling (inflammation).  Bleeding.  What happens before the procedure? Eating and drinking Follow instructions from your doctor about eating and drinking. These instructions may include:  A few days before the procedure - follow a low-fiber diet. ? Avoid nuts. ? Avoid seeds. ? Avoid dried fruit. ? Avoid raw fruits. ? Avoid vegetables.  1-3 days before the procedure - follow a clear liquid diet. Avoid liquids that have red or purple dye. Drink only clear liquids, such as: ? Clear  broth or bouillon. ? Black coffee or tea. ? Clear juice. ? Clear soft drinks or sports drinks. ? Gelatin dessert. ? Popsicles.  On the day of the procedure - do not eat or drink anything during the 2 hours before the procedure.  Bowel prep If you were prescribed an oral bowel prep:  Take it as told by your doctor. Starting the day before your procedure, you will need to drink a lot of liquid. The liquid will cause you to poop (have bowel movements) until your poop is almost clear or light green.  If your skin or butt gets irritated from diarrhea, you may: ? Wipe the area with wipes that have medicine in them, such as adult wet wipes with aloe and vitamin E. ? Put something on your skin that soothes the area, such as petroleum jelly.  If you throw up (vomit) while drinking the bowel prep, take a break for up to 60 minutes. Then begin the bowel prep again. If you keep throwing up and you cannot take the bowel prep without throwing up, call your doctor.  General instructions  Ask your doctor about changing or stopping your normal medicines. This is important if you take diabetes medicines or blood thinners.  Plan to have someone take you home from the hospital or clinic. What happens during the procedure?  An IV tube may be  put into one of your veins.  You will be given medicine to help you relax (sedative).  To reduce your risk of infection: ? Your doctors will wash their hands. ? Your anal area will be washed with soap.  You will be asked to lie on your side with your knees bent.  Your doctor will get a long, thin, flexible tube ready. The tube will have a camera and a light on the end.  The tube will be put into your anus.  The tube will be gently put into your large intestine.  Air will be delivered into your large intestine to keep it open. You may feel some pressure or cramping.  The camera will be used to take photos.  A small tissue sample may be removed from your  body to be looked at under a microscope (biopsy). If any possible problems are found, the tissue will be sent to a lab for testing.  If small growths are found, your doctor may remove them and have them checked for cancer.  The tube that was put into your anus will be slowly removed. The procedure may vary among doctors and hospitals. What happens after the procedure?  Your doctor will check on you often until the medicines you were given have worn off.  Do not drive for 24 hours after the procedure.  You may have a small amount of blood in your poop.  You may pass gas.  You may have mild cramps or bloating in your belly (abdomen).  It is up to you to get the results of your procedure. Ask your doctor, or the department performing the procedure, when your results will be ready. This information is not intended to replace advice given to you by your health care provider. Make sure you discuss any questions you have with your health care provider. Document Released: 07/16/2010 Document Revised: 04/13/2016 Document Reviewed: 08/25/2015 Elsevier Interactive Patient Education  2017 [employer] Inc[employer]  Colonoscopy, Adult, Care After This sheet gives you information about how to care for yourself after your procedure. Your health care provider may also give you more specific instructions. If you have problems or questions, contact your health care provider. What can I expect after the procedure? After the procedure, it is common to have:  A small amount of blood in your stool for 24 hours after the procedure.  Some gas.  Mild abdominal cramping or bloating.  Follow these instructions at home: General instructions   For the first 24 hours after the procedure: ? Do not drive or use machinery. ? Do not sign important documents. ? Do not drink alcohol. ? Do your regular daily activities at a slower pace than normal. ? Eat soft, easy-to-digest foods. ? Rest often.  Take over-the-counter  or prescription medicines only as told by your health care provider.  It is up to you to get the results of your procedure. Ask your health care provider, or the department performing the procedure, when your results will be ready. Relieving cramping and bloating  Try walking around when you have cramps or feel bloated.  Apply heat to your abdomen as told by your health care provider. Use a heat source that your health care provider recommends, such as a moist heat pack or a heating pad. ? Place a towel between your skin and the heat source. ? Leave the heat on for 20-30 minutes. ? Remove the heat if your skin turns bright red. This is especially important if you are  unable to feel pain, heat, or cold. You may have a greater risk of getting burned. Eating and drinking  Drink enough fluid to keep your urine clear or pale yellow.  Resume your normal diet as instructed by your health care provider. Avoid heavy or fried foods that are hard to digest.  Avoid drinking alcohol for as long as instructed by your health care provider. Contact a health care provider if:  You have blood in your stool 2-3 days after the procedure. Get help right away if:  You have more than a small spotting of blood in your stool.  You pass large blood clots in your stool.  Your abdomen is swollen.  You have nausea or vomiting.  You have a fever.  You have increasing abdominal pain that is not relieved with medicine. This information is not intended to replace advice given to you by your health care provider. Make sure you discuss any questions you have with your health care provider. Document Released: 01/26/2004 Document Revised: 03/07/2016 Document Reviewed: 08/25/2015 Elsevier Interactive Patient Education  2018 [place] Anesthesia is a term that refers to techniques, procedures, and medicines that help a person stay safe and comfortable during a medical procedure.  Monitored anesthesia care, or sedation, is one type of anesthesia. Your anesthesia specialist may recommend sedation if you will be having a procedure that does not require you to be unconscious, such as:  Cataract surgery.  A dental procedure.  A biopsy.  A colonoscopy.  During the procedure, you may receive a medicine to help you relax (sedative). There are three levels of sedation:  Mild sedation. At this level, you may feel awake and relaxed. You will be able to follow directions.  Moderate sedation. At this level, you will be sleepy. You may not remember the procedure.  Deep sedation. At this level, you will be asleep. You will not remember the procedure.  The more medicine you are given, the deeper your level of sedation will be. Depending on how you respond to the procedure, the anesthesia specialist may change your level of sedation or the type of anesthesia to fit your needs. An anesthesia specialist will monitor you closely during the procedure. Let your health care provider know about:  Any allergies you have.  All medicines you are taking, including vitamins, herbs, eye drops, creams, and over-the-counter medicines.  Any use of steroids (by mouth or as a cream).  Any problems you or family members have had with sedatives and anesthetic medicines.  Any blood disorders you have.  Any surgeries you have had.  Any medical conditions you have, such as sleep apnea.  Whether you are pregnant or may be pregnant.  Any use of cigarettes, alcohol, or street drugs. What are the risks? Generally, this is a safe procedure. However, problems may occur, including:  Getting too much medicine (oversedation).  Nausea.  Allergic reaction to medicines.  Trouble breathing. If this happens, a breathing tube may be used to help with breathing. It will be removed when you are awake and breathing on your own.  Heart trouble.  Lung trouble.  Before the procedure Staying  hydrated Follow instructions from your health care provider about hydration, which may include:  Up to 2 hours before the procedure - you may continue to drink clear liquids, such as water, clear fruit juice, black coffee, and plain tea.  Eating and drinking restrictions Follow instructions from your health care provider about eating and drinking, which  may include:  8 hours before the procedure - stop eating heavy meals or foods such as meat, fried foods, or fatty foods.  6 hours before the procedure - stop eating light meals or foods, such as toast or cereal.  6 hours before the procedure - stop drinking milk or drinks that contain milk.  2 hours before the procedure - stop drinking clear liquids.  Medicines Ask your health care provider about:  Changing or stopping your regular medicines. This is especially important if you are taking diabetes medicines or blood thinners.  Taking medicines such as aspirin and ibuprofen. These medicines can thin your blood. Do not take these medicines before your procedure if your health care provider instructs you not to.  Tests and exams  You will have a physical exam.  You may have blood tests done to show: ? How well your kidneys and liver are working. ? How well your blood can clot.  General instructions  Plan to have someone take you home from the hospital or clinic.  If you will be going home right after the procedure, plan to have someone with you for 24 hours.  What happens during the procedure?  Your blood pressure, heart rate, breathing, level of pain and overall condition will be monitored.  An IV tube will be inserted into one of your veins.  Your anesthesia specialist will give you medicines as needed to keep you comfortable during the procedure. This may mean changing the level of sedation.  The procedure will be performed. After the procedure  Your blood pressure, heart rate, breathing rate, and blood oxygen level  will be monitored until the medicines you were given have worn off.  Do not drive for 24 hours if you received a sedative.  You may: ? Feel sleepy, clumsy, or nauseous. ? Feel forgetful about what happened after the procedure. ? Have a sore throat if you had a breathing tube during the procedure. ? Vomit. This information is not intended to replace advice given to you by your health care provider. Make sure you discuss any questions you have with your health care provider. Document Released: 03/09/2005 Document Revised: 11/20/2015 Document Reviewed: 10/04/2015 Elsevier Interactive Patient Education  2018 [place], Care After These instructions provide you with information about caring for yourself after your procedure. Your health care provider may also give you more specific instructions. Your treatment has been planned according to current medical practices, but problems sometimes occur. Call your health care provider if you have any problems or questions after your procedure. What can I expect after the procedure? After your procedure, it is common to:  Feel sleepy for several hours.  Feel clumsy and have poor balance for several hours.  Feel forgetful about what happened after the procedure.  Have poor judgment for several hours.  Feel nauseous or vomit.  Have a sore throat if you had a breathing tube during the procedure.  Follow these instructions at home: For at least 24 hours after the procedure:   Do not: ? Participate in activities in which you could fall or become injured. ? Drive. ? Use heavy machinery. ? Drink alcohol. ? Take sleeping pills or medicines that cause drowsiness. ? Make important decisions or sign legal documents. ? Take care of children on your own.  Rest. Eating and drinking  Follow the diet that is recommended by your health care provider.  If you vomit, drink water, juice, or soup when you can drink  without  vomiting.  Make sure you have little or no nausea before eating solid foods. General instructions  Have a responsible adult stay with you until you are awake and alert.  Take over-the-counter and prescription medicines only as told by your health care provider.  If you smoke, do not smoke without supervision.  Keep all follow-up visits as told by your health care provider. This is important. Contact a health care provider if:  You keep feeling nauseous or you keep vomiting.  You feel light-headed.  You develop a rash.  You have a fever. Get help right away if:  You have trouble breathing. This information is not intended to replace advice given to you by your health care provider. Make sure you discuss any questions you have with your health care provider. Document Released: 10/04/2015 Document Revised: 02/03/2016 Document Reviewed: 10/04/2015 Elsevier Interactive Patient Education  [employer].

## 2017-10-17 ENCOUNTER — Encounter (HOSPITAL_COMMUNITY): Payer: Self-pay

## 2017-10-17 ENCOUNTER — Encounter (HOSPITAL_COMMUNITY)
Admission: RE | Admit: 2017-10-17 | Discharge: 2017-10-17 | Disposition: A | Discharge status: Home / Self Care | Payer: BLUE CROSS/BLUE SHIELD | Source: Ambulatory Visit | Attending: Gastroenterology | Admitting: Gastroenterology

## 2017-10-17 ENCOUNTER — Other Ambulatory Visit: Payer: Self-pay

## 2017-10-17 DIAGNOSIS — Z01812 Encounter for preprocedural laboratory examination: Secondary | ICD-10-CM | POA: Insufficient documentation

## 2017-10-17 HISTORY — DX: Gastro-esophageal reflux disease without esophagitis: K21.9

## 2017-10-17 HISTORY — DX: Anxiety disorder, unspecified: F41.9

## 2017-10-17 LAB — BASIC METABOLIC PANEL
Anion gap: 13 (ref 5–15)
BUN: 13 mg/dL (ref 6–20)
CALCIUM: 9.5 mg/dL (ref 8.9–10.3)
CHLORIDE: 105 mmol/L (ref 101–111)
CO2: 23 mmol/L (ref 22–32)
CREATININE: 1.09 mg/dL (ref 0.61–1.24)
GFR calc non Af Amer: >60 mL/min (ref >60)
Glucose, Bld: 111 mg/dL — ABNORMAL HIGH (ref 65–99)
Potassium: 4 mmol/L (ref 3.5–5.1)
Sodium: 141 mmol/L (ref 135–145)

## 2017-10-17 LAB — CBC
HCT: 46.2 % (ref 39.0–52.0)
Hemoglobin: 15.2 g/dL (ref 13.0–17.0)
MCH: 27.4 pg (ref 26.0–34.0)
MCHC: 32.9 g/dL (ref 30.0–36.0)
MCV: 83.2 fL (ref 78.0–100.0)
Platelets: 225 10*3/uL (ref 150–400)
RBC: 5.55 MIL/uL (ref 4.22–5.81)
RDW: 14.8 % (ref 11.5–15.5)
WBC: 6.2 10*3/uL (ref 4.0–10.5)

## 2017-10-24 ENCOUNTER — Encounter (HOSPITAL_COMMUNITY): Payer: Self-pay | Admitting: *Deleted

## 2017-10-24 ENCOUNTER — Ambulatory Visit (HOSPITAL_COMMUNITY)
Admission: RE | Admit: 2017-10-24 | Discharge: 2017-10-24 | Disposition: A | Discharge status: Home / Self Care | Payer: BLUE CROSS/BLUE SHIELD | Source: Ambulatory Visit | Attending: Gastroenterology | Admitting: Gastroenterology

## 2017-10-24 ENCOUNTER — Ambulatory Visit (HOSPITAL_COMMUNITY): Payer: BLUE CROSS/BLUE SHIELD | Admitting: Anesthesiology

## 2017-10-24 ENCOUNTER — Encounter (HOSPITAL_COMMUNITY)
Admission: RE | Disposition: A | Discharge status: Home / Self Care | Payer: Self-pay | Source: Ambulatory Visit | Attending: Gastroenterology

## 2017-10-24 DIAGNOSIS — Q438 Other specified congenital malformations of intestine: Secondary | ICD-10-CM | POA: Diagnosis not present

## 2017-10-24 DIAGNOSIS — K625 Hemorrhage of anus and rectum: Secondary | ICD-10-CM

## 2017-10-24 DIAGNOSIS — F419 Anxiety disorder, unspecified: Secondary | ICD-10-CM | POA: Diagnosis not present

## 2017-10-24 DIAGNOSIS — D126 Benign neoplasm of colon, unspecified: Secondary | ICD-10-CM

## 2017-10-24 DIAGNOSIS — K644 Residual hemorrhoidal skin tags: Secondary | ICD-10-CM | POA: Insufficient documentation

## 2017-10-24 DIAGNOSIS — K648 Other hemorrhoids: Secondary | ICD-10-CM | POA: Insufficient documentation

## 2017-10-24 DIAGNOSIS — K635 Polyp of colon: Secondary | ICD-10-CM | POA: Diagnosis not present

## 2017-10-24 DIAGNOSIS — K921 Melena: Secondary | ICD-10-CM | POA: Diagnosis not present

## 2017-10-24 DIAGNOSIS — K219 Gastro-esophageal reflux disease without esophagitis: Secondary | ICD-10-CM | POA: Diagnosis not present

## 2017-10-24 DIAGNOSIS — K649 Unspecified hemorrhoids: Secondary | ICD-10-CM

## 2017-10-24 DIAGNOSIS — F17290 Nicotine dependence, other tobacco product, uncomplicated: Secondary | ICD-10-CM | POA: Diagnosis not present

## 2017-10-24 DIAGNOSIS — K5900 Constipation, unspecified: Secondary | ICD-10-CM

## 2017-10-24 DIAGNOSIS — D123 Benign neoplasm of transverse colon: Secondary | ICD-10-CM | POA: Diagnosis not present

## 2017-10-24 DIAGNOSIS — Z79899 Other long term (current) drug therapy: Secondary | ICD-10-CM | POA: Diagnosis not present

## 2017-10-24 HISTORY — PX: POLYPECTOMY: SHX5525

## 2017-10-24 HISTORY — PX: COLONOSCOPY WITH PROPOFOL: SHX5780

## 2017-10-24 HISTORY — PX: HEMORRHOID BANDING: SHX5850

## 2017-10-24 SURGERY — COLONOSCOPY WITH PROPOFOL
Anesthesia: Monitor Anesthesia Care

## 2017-10-24 MED ORDER — FENTANYL CITRATE (PF) 100 MCG/2ML IJ SOLN
INTRAMUSCULAR | Status: AC
  Filled 2017-10-24: qty 2

## 2017-10-24 MED ORDER — PROPOFOL 10 MG/ML IV BOLUS
INTRAVENOUS | Status: AC
  Filled 2017-10-24: qty 40

## 2017-10-24 MED ORDER — SODIUM CHLORIDE 0.9 % IJ SOLN
INTRAMUSCULAR | Status: AC
  Filled 2017-10-24: qty 10

## 2017-10-24 MED ORDER — PROPOFOL 10 MG/ML IV BOLUS
INTRAVENOUS | Status: DC | PRN
  Administered 2017-10-24 (×2): 20 mg via INTRAVENOUS

## 2017-10-24 MED ORDER — LIDOCAINE HCL (CARDIAC) PF 100 MG/5ML IV SOSY
PREFILLED_SYRINGE | INTRAVENOUS | Status: DC | PRN
  Administered 2017-10-24: 40 mg via INTRAVENOUS

## 2017-10-24 MED ORDER — CHLORHEXIDINE GLUCONATE CLOTH 2 % EX PADS: 6.0000 | MEDICATED_PAD | Freq: Once | CUTANEOUS | Status: DC

## 2017-10-24 MED ORDER — MIDAZOLAM HCL 2 MG/2ML IJ SOLN
INTRAMUSCULAR | Status: AC
  Filled 2017-10-24: qty 2

## 2017-10-24 MED ORDER — PROPOFOL 500 MG/50ML IV EMUL
INTRAVENOUS | Status: DC | PRN
  Administered 2017-10-24: 08:00:00 via INTRAVENOUS
  Administered 2017-10-24: 150 ug/kg/min via INTRAVENOUS

## 2017-10-24 MED ORDER — FENTANYL CITRATE (PF) 250 MCG/5ML IJ SOLN
INTRAMUSCULAR | Status: DC | PRN
  Administered 2017-10-24 (×3): 25 ug via INTRAVENOUS

## 2017-10-24 MED ORDER — PROPOFOL 10 MG/ML IV BOLUS
INTRAVENOUS | Status: AC
  Filled 2017-10-24: qty 20

## 2017-10-24 MED ORDER — LIDOCAINE HCL (PF) 1 % IJ SOLN
INTRAMUSCULAR | Status: AC
  Filled 2017-10-24: qty 5

## 2017-10-24 MED ORDER — MIDAZOLAM HCL 2 MG/2ML IJ SOLN
INTRAMUSCULAR | Status: DC | PRN
  Administered 2017-10-24: 2 mg via INTRAVENOUS

## 2017-10-24 MED ORDER — EPHEDRINE SULFATE 50 MG/ML IJ SOLN
INTRAMUSCULAR | Status: AC
  Filled 2017-10-24: qty 1

## 2017-10-24 MED ORDER — STERILE WATER FOR IRRIGATION IR SOLN
Status: DC | PRN
  Administered 2017-10-24: 100 mL

## 2017-10-24 MED ORDER — LACTATED RINGERS IV SOLN
INTRAVENOUS | Status: DC
  Administered 2017-10-24: 1000 mL via INTRAVENOUS

## 2017-10-24 NOTE — Anesthesia Postprocedure Evaluation (Signed)
Anesthesia Post Note  Patient: Glenn Coleman  Procedure(s) Performed: COLONOSCOPY WITH PROPOFOL (N/A ) HEMORRHOID BANDING (N/A ) POLYPECTOMY  Patient location during evaluation: PACU Anesthesia Type: MAC Level of consciousness: awake, awake and alert, oriented and patient cooperative Pain management: pain level controlled Vital Signs Assessment: post-procedure vital signs reviewed and stable Respiratory status: spontaneous breathing, nonlabored ventilation and respiratory function stable Cardiovascular status: stable Postop Assessment: no apparent nausea or vomiting Anesthetic complications: no     Last Vitals:  Vitals:   10/24/17 0650 10/24/17 0655  BP: (!) 153/96 (!) 149/96  Pulse:    Resp: (!) 23 13  Temp:    SpO2: 98% 100%    Last Pain:  Vitals:   10/24/17 0804  PainSc: 0-No pain                 Cassara Nida L

## 2017-10-24 NOTE — Op Note (Signed)
[facility] Patient Name: Glenn Coleman Procedure Date: 10/24/2017 7:13 AM MRN: [id number] Date of Birth: 1971/06/13 Attending MD: Barney Drain MD, MD CSN: [id number] Age: 47 Admit Type: Outpatient Procedure:                Colonoscopy-COLD FORCEPS/SNARE CAUTERY POLYPECTOMY                            & BANDING x3 Indications:              Hematochezia Providers:                Barney Drain MD, MD, Rosina Lowenstein, RN, Nelma Rothman,                            Technician Referring MD:             Glenda Chroman Medicines:                Propofol per Anesthesia Complications:            No immediate complications. Estimated Blood Loss:     Estimated blood loss was minimal. Procedure:                Pre-Anesthesia Assessment:                           - Prior to the procedure, a History and Physical                            was performed, and patient medications and                            allergies were reviewed. The patient's tolerance of                            previous anesthesia was also reviewed. The risks                            and benefits of the procedure and the sedation                            options and risks were discussed with the patient.                            All questions were answered, and informed consent                            was obtained. Prior Anticoagulants: The patient has                            taken ibuprofen, last dose was 7 days prior to                            procedure. ASA Grade Assessment: II - A patient  with mild systemic disease. After reviewing the                            risks and benefits, the patient was deemed in                            satisfactory condition to undergo the procedure.                            After obtaining informed consent, the colonoscope                            was passed under direct vision. Throughout the                            procedure, the patient's  blood pressure, pulse, and                            oxygen saturations were monitored continuously. The                            EC-[id number] ([id number]) scope was introduced through                            the anus and advanced to the 7 cm into the ileum.                            The colonoscopy was somewhat difficult due to a                            tortuous colon. Successful completion of the                            procedure was aided by straightening and shortening                            the scope to obtain bowel loop reduction and                            COLOWRAP. The patient tolerated the procedure well.                            The quality of the bowel preparation was good BUT                            OIL DROPLETS CREATED A SLIGHT FILM ON THE SCOPE.                            The terminal ileum, ileocecal valve, appendiceal                            orifice, and rectum were photographed. Scope In: 7:40:15 AM Scope Out: 8:01:31 AM Scope Withdrawal Time:  0 hours 18 minutes 47 seconds  Total Procedure Duration: 0 hours 21 minutes 16 seconds  Findings:      The terminal ileum appeared normal.      Two sessile polyps were found in the sigmoid colon and transverse colon.       The polyps were 1 to 2 mm in size. These polyps were removed with a cold       biopsy forceps. Resection and retrieval were complete.      A 6 mm polyp was found in the sigmoid colon. The polyp was       semi-pedunculated. The polyp was removed with a hot snare. Resection and       retrieval were complete.      The recto-sigmoid colon and sigmoid colon were mildly redundant.      Internal hemorrhoids were found during retroflexion. The hemorrhoids       were moderate. Three bands were successfully placed. There was no       bleeding at the end of the procedure.      External hemorrhoids were found during retroflexion and during digital       exam. Impression:               - The examined  portion of the ileum was normal.                           - Two 1 to 2 mm polyps in the sigmoid colon and in                            the transverse colon, removed with a cold biopsy                            forceps. Resected and retrieved.                           - One 6 mm polyp in the sigmoid colon, removed with                            a hot snare. Resected and retrieved.                           - Redundant colon.                           - Internal hemorrhoids. Banded.                           - External hemorrhoids. Moderate Sedation:      Per Anesthesia Care Recommendation:           - Repeat colonoscopy in 3 - 5 years for                            surveillance. NO BROTH WITH OIL DROPLETS OR MINERAL                            OIL ENEMAS. CONSIDER FLEX SIG WITH NEMORRHOID  BANDING IF RECTAL BLEEDING NOT COMPLETELY RESOLVED.                           - High fiber diet.                           - Continue present medications.                           - Await pathology results.                           - Return to my office in 4 months.                           - Patient has a contact number available for                            emergencies. The signs and symptoms of potential                            delayed complications were discussed with the                            patient. Return to normal activities tomorrow.                            Written discharge instructions were provided to the                            patient. Procedure Code(s):        --- Professional ---                           [phone], Colonoscopy, flexible; with removal of                            tumor(s), polyp(s), or other lesion(s) by snare                            technique                           [phone], Colonoscopy, flexible; with band ligation(s)                            (eg, hemorrhoids)                           45380, 59, Colonoscopy,  flexible; with biopsy,                            single or multiple Diagnosis Code(s):        --- Professional ---                           K64.4, Residual hemorrhoidal skin tags  K64.8, Other hemorrhoids                           D12.5, Benign neoplasm of sigmoid colon                           D12.3, Benign neoplasm of transverse colon (hepatic                            flexure or splenic flexure)                           K92.1, Melena (includes Hematochezia)                           Q43.8, Other specified congenital malformations of                            intestine CPT copyright 2017 American Medical Association. All rights reserved. The codes documented in this report are preliminary and upon coder review may  be revised to meet current compliance requirements. Barney Drain, MD Barney Drain MD, MD 10/24/2017 8:13:48 AM This report has been signed electronically. Number of Addenda: 0

## 2017-10-24 NOTE — H&P (Signed)
Primary Care Physician:  Glenda Chroman, MD Primary Gastroenterologist:  Dr. Oneida Alar  Pre-Procedure History & Physical: HPI:  Glenn Coleman is a 47 y.o. male here for  BRBPR.   Past Medical History:  Diagnosis Date  . Anxiety   . Constipation   . GERD (gastroesophageal reflux disease)   . Hemorrhoids     Past Surgical History:  Procedure Laterality Date  . None to date     As of 08/31/17    Prior to Admission medications   Medication Sig Start Date End Date Taking? Authorizing Provider  Coenzyme Q10 (COQ10) 100 MG CAPS Take 100 mg by mouth daily.   Yes [provider]  docusate sodium (COLACE) 100 MG capsule Take 100 mg by mouth daily as needed for mild constipation.    Yes [provider]  esomeprazole (NEXIUM) 40 MG capsule Take 40 mg by mouth daily as needed (acid reflux).   Yes [provider]  hydrocortisone-pramoxine [facility]) 2.5-1 % rectal cream Place 1 application rectally daily as needed for hemorrhoids.  10/06/17  Yes [provider]  ibuprofen (ADVIL,MOTRIN) 200 MG tablet Take 200 mg by mouth daily as needed for headache or moderate pain.   Yes [provider]  Omega-3 Fatty Acids (FISH OIL) 1200 MG CAPS Take 1,200 mg by mouth daily.   Yes [provider]  polyethylene glycol (MIRALAX / GLYCOLAX) packet Take 17 g by mouth daily as needed for moderate constipation.   Yes [provider]  polyethylene glycol-electrolytes (TRILYTE) 420 g solution Take 4,000 mLs by mouth as directed. 08/31/17  Yes Andris Brothers, Marga Melnick, MD  hydrocortisone (ANUSOL-HC) 25 MG suppository Place 1 suppository (25 mg total) rectally 2 (two) times daily. Patient not taking: Reported on 10/10/2017 10/18/13   Lysbeth Penner, FNP    Allergies as of 08/31/2017  . (No Known Allergies)    Family History  Problem Relation Age of Onset  . Colon cancer Neg Hx     Social History   Socioeconomic History  . Marital status: Married     Spouse name: Not on file  . Number of children: Not on file  . Years of education: Not on file  . Highest education level: Not on file  Occupational History  . Not on file  Social Needs  . Financial resource strain: Not on file  . Food insecurity:    Worry: Not on file    Inability: Not on file  . Transportation needs:    Medical: Not on file    Non-medical: Not on file  Tobacco Use  . Smoking status: Current Some Day Smoker    Packs/day: 0.00    Types: Cigars  . Smokeless tobacco: Never Used  . Tobacco comment: 1 cigar  Substance and Sexual Activity  . Alcohol use: Yes    Alcohol/week: 0.0 oz    Comment: Every weekend, about 3-5 drinks  . Drug use: No  . Sexual activity: Not on file  Lifestyle  . Physical activity:    Days per week: Not on file    Minutes per session: Not on file  . Stress: Not on file  Relationships  . Social connections:    Talks on phone: Not on file    Gets together: Not on file    Attends religious service: Not on file    Active member of club or organization: Not on file    Attends meetings of clubs or organizations: Not on file  Relationship status: Not on file  . Intimate partner violence:    Fear of current or ex partner: Not on file    Emotionally abused: Not on file    Physically abused: Not on file    Forced sexual activity: Not on file  Other Topics Concern  . Not on file  Social History Narrative  . Not on file    Review of Systems: See HPI, otherwise negative ROS   Physical Exam: BP (!) 149/96   Pulse 83   Temp 98.9 F (37.2 C)   Resp 13   SpO2 100%  General:   Alert,  pleasant and cooperative in NAD Head:  Normocephalic and atraumatic. Neck:  Supple; Lungs:  Clear throughout to auscultation.    Heart:  Regular rate and rhythm. Abdomen:  Soft, nontender and nondistended. Normal bowel sounds, without guarding, and without rebound.   Neurologic:  Alert and  oriented x4;  grossly normal  neurologically.  Impression/Plan:     RECTAL BLEEDING  PLAN:  1. TCS/? Hemorrhoid banding TODAY. DISCUSSED PROCEDURE, BENEFITS, & RISKS: < 1% chance of medication reaction, bleeding, perforation, PELVIC VEIN SEPSIS, or rupture of spleen/liver.

## 2017-10-24 NOTE — Discharge Instructions (Signed)
You have MODERATE SIZE internal AND EXTERNAL hemorrhoids. YOU had THREE POLYPS removed. I PLACED 3 BANDS TO TREAT YOUR INTERNAL HEMORRJOIDS WHICH CAN CAUSE RECTAL BLEEDING. YOU MAY SOME MILD BLEEDING OVER THE NEXT 3 TO 5 DAYS.    PLEASE CALL [phone] IF YOU HAVE A FEVER, A LARGE AMOUNT OF BLEEDING, OR DIFFICULTY URINATING.  DRINK WATER TO KEEP URINE LIGHT YELLOW.  FOLLOW A HIGH FIBER DIET. AVOID ITEMS THAT CAUSE BLOATING & GAS. SEE INFO BELOW.  YOU MAY USE NAPROXEN OR IBUPROFEN TWICE DAILY FOR RECTAL DISCOMFORT. USE TYLENOL IF NEEDED FOR ADDITIONAL PAIN RELIEF.  IF NEEDED, USE MIRALAX OR COLACE TWICE DAILY TO SOFTEN STOOL. DO NOT USE MIRALAX IF YOU ARE TAKING LINZESS.  YOUR BIOPSY RESULTS WILL BE AVAILABLE IN 7 DAYS.  FOLLOW UP IN 4 MOS. IF YOU CONTINUE TO HAVE RECTAL BLEEDING YOU COULD HAVE SIGMOIDOSCOPY WITH HEMORRHOID BANDING OR SURGERY.  Next colonoscopy in 3-5 years.   Follow up with your physician for  hypertension Colonoscopy Care After Read the instructions outlined below and refer to this sheet in the next week. These discharge instructions provide you with general information on caring for yourself after you leave the hospital. While your treatment has been planned according to the most current medical practices available, unavoidable complications occasionally occur. If you have any problems or questions after discharge, call DR. FIELDS, [phone].  ACTIVITY  You may resume your regular activity, but move at a slower pace for the next 24 hours.   Take frequent rest periods for the next 24 hours.   Walking will help get rid of the air and reduce the bloated feeling in your belly (abdomen).   No driving for 24 hours (because of the medicine (anesthesia) used during the test).   You may shower.   Do not sign any important legal documents or operate any machinery for 24 hours (because of the anesthesia used during the test).    NUTRITION  Drink plenty of  fluids.   You may resume your normal diet as instructed by your doctor.   Begin with a light meal and progress to your normal diet. Heavy or fried foods are harder to digest and may make you feel sick to your stomach (nauseated).   Avoid alcoholic beverages for 24 hours or as instructed.    MEDICATIONS  You may resume your normal medications.   WHAT YOU CAN EXPECT TODAY  Some feelings of bloating in the abdomen.   Passage of more gas than usual.   Spotting of blood in your stool or on the toilet paper  .  IF YOU HAD POLYPS REMOVED DURING THE COLONOSCOPY:  Eat a soft diet IF YOU HAVE NAUSEA, BLOATING, ABDOMINAL PAIN, OR VOMITING.    FINDING OUT THE RESULTS OF YOUR TEST Not all test results are available during your visit. DR. Oneida Alar WILL CALL YOU WITHIN 14 DAYS OF YOUR PROCEDUE WITH YOUR RESULTS. Do not assume everything is normal if you have not heard from DR. FIELDS, CALL HER OFFICE AT [phone].  SEEK IMMEDIATE MEDICAL ATTENTION AND CALL THE OFFICE: [phone] IF:  You have more than a spotting of blood in your stool.   Your belly is swollen (abdominal distention).   You are nauseated or vomiting.   You have a temperature over 101F.   You have abdominal pain or discomfort that is severe or gets worse throughout the day.   POTENTIAL HEMORRHOIDAL BANDING COMPLICATIONS:  COMMON: 1. MINOR PAIN  UNCOMMON: 1. ABSCESS 2. BAND  FALLS OFF 3. PROLAPSE OF HEMORRHOIDS AND PAIN 4. ULCER BLEEDING  A. USUALLY SELF-LIMITED: MAY LAST 3-5 DAYS  B. MAY REQUIRE INTERVENTION: 1-2 WEEKS AFTER INTERACTIONS 5. NECROTIZING PELVIC SEPSIS  A. SYMPTOMS: FEVER, PAIN, DIFFICULTY URINATING   Hemorrhoids Hemorrhoids are dilated (enlarged) veins around the rectum. Sometimes clots will form in the veins. This makes them swollen and painful. These are called thrombosed hemorrhoids. Causes of hemorrhoids include:  Constipation.   Straining to have a bowel movement.   HEAVY  LIFTING  HOME CARE INSTRUCTIONS  Eat a well balanced diet and drink 6 to 8 glasses of water every day to avoid constipation. You may also use a bulk laxative.   Avoid straining to have bowel movements.   Keep anal area dry and clean.   Do not use a donut shaped pillow or sit on the toilet for long periods. This increases blood pooling and pain.   Move your bowels when your body has the urge; this will require less straining and will decrease pain and pressure.  High-Fiber Diet A high-fiber diet changes your normal diet to include more whole grains, legumes, fruits, and vegetables. Changes in the diet involve replacing refined carbohydrates with unrefined foods. The calorie level of the diet is essentially unchanged. The Dietary Reference Intake (recommended amount) for adult males is 38 grams per day. For adult females, it is 25 grams per day. Pregnant and lactating women should consume 28 grams of fiber per day. Fiber is the intact part of a plant that is not broken down during digestion. Functional fiber is fiber that has been isolated from the plant to provide a beneficial effect in the body. PURPOSE  Increase stool bulk.   Ease and regulate bowel movements.   Lower cholesterol.   REDUCE RISK OF COLON CANCER  INDICATIONS THAT YOU NEED MORE FIBER  Constipation and hemorrhoids.   Uncomplicated diverticulosis (intestine condition) and irritable bowel syndrome.   Weight management.   As a protective measure against hardening of the arteries (atherosclerosis), diabetes, and cancer.   GUIDELINES FOR INCREASING FIBER IN THE DIET  Start adding fiber to the diet slowly. A gradual increase of about 5 more grams (2 slices of whole-wheat bread, 2 servings of most fruits or vegetables, or 1 bowl of high-fiber cereal) per day is best. Too rapid an increase in fiber may result in constipation, flatulence, and bloating.   Drink enough water and fluids to keep your urine clear or pale  yellow. Water, juice, or caffeine-free drinks are recommended. Not drinking enough fluid may cause constipation.   Eat a variety of high-fiber foods rather than one type of fiber.   Try to increase your intake of fiber through using high-fiber foods rather than fiber pills or supplements that contain small amounts of fiber.   The goal is to change the types of food eaten. Do not supplement your present diet with high-fiber foods, but replace foods in your present diet.   INCLUDE A VARIETY OF FIBER SOURCES  Replace refined and processed grains with whole grains, canned fruits with fresh fruits, and incorporate other fiber sources. White rice, white breads, and most bakery goods contain little or no fiber.   Brown whole-grain rice, buckwheat oats, and many fruits and vegetables are all good sources of fiber. These include: broccoli, Brussels sprouts, cabbage, cauliflower, beets, sweet potatoes, white potatoes (skin on), carrots, tomatoes, eggplant, squash, berries, fresh fruits, and dried fruits.   Cereals appear to be the richest source of fiber. Cereal  fiber is found in whole grains and bran. Bran is the fiber-rich outer coat of cereal grain, which is largely removed in refining. In whole-grain cereals, the bran remains. In breakfast cereals, the largest amount of fiber is found in those with "bran" in their names. The fiber content is sometimes indicated on the label.   You may need to include additional fruits and vegetables each day.   In baking, for 1 cup white flour, you may use the following substitutions:   1 cup whole-wheat flour minus 2 tablespoons.   1/2 cup white flour plus 1/2 cup whole-wheat flour.    Hypertension Hypertension is another name for high blood pressure. High blood pressure forces your heart to work harder to pump blood. This can cause problems over time. There are two numbers in a blood pressure reading. There is a top number (systolic) over a bottom number  (diastolic). It is best to have a blood pressure below 120/80. Healthy choices can help lower your blood pressure. You may need medicine to help lower your blood pressure if:  Your blood pressure cannot be lowered with healthy choices.  Your blood pressure is higher than 130/80.  Follow these instructions at home: Eating and drinking  If directed, follow the DASH eating plan. This diet includes: ? Filling half of your plate at each meal with fruits and vegetables. ? Filling one quarter of your plate at each meal with whole grains. Whole grains include whole wheat pasta, brown rice, and whole grain bread. ? Eating or drinking low-fat dairy products, such as skim milk or low-fat yogurt. ? Filling one quarter of your plate at each meal with low-fat (lean) proteins. Low-fat proteins include fish, skinless chicken, eggs, beans, and tofu. ? Avoiding fatty meat, cured and processed meat, or chicken with skin. ? Avoiding premade or processed food.  Eat less than 1,500 mg of salt (sodium) a day.  Limit alcohol use to no more than 1 drink a day for nonpregnant women and 2 drinks a day for men. One drink equals 12 oz of beer, 5 oz of wine, or 1 oz of hard liquor. Lifestyle  Work with your doctor to stay at a healthy weight or to lose weight. Ask your doctor what the best weight is for you.  Get at least 30 minutes of exercise that causes your heart to beat faster (aerobic exercise) most days of the week. This may include walking, swimming, or biking.  Get at least 30 minutes of exercise that strengthens your muscles (resistance exercise) at least 3 days a week. This may include lifting weights or pilates.  Do not use any products that contain nicotine or tobacco. This includes cigarettes and e-cigarettes. If you need help quitting, ask your doctor.  Check your blood pressure at home as told by your doctor.  Keep all follow-up visits as told by your doctor. This is important. Medicines  Take  over-the-counter and prescription medicines only as told by your doctor. Follow directions carefully.  Do not skip doses of blood pressure medicine. The medicine does not work as well if you skip doses. Skipping doses also puts you at risk for problems.  Ask your doctor about side effects or reactions to medicines that you should watch for. Contact a doctor if:  You think you are having a reaction to the medicine you are taking.  You have headaches that keep coming back (recurring).  You feel dizzy.  You have swelling in your ankles.  You have trouble  with your vision. Get help right away if:  You get a very bad headache.  You start to feel confused.  You feel weak or numb.  You feel faint.  You get very bad pain in your: ? Chest. ? Belly (abdomen).  You throw up (vomit) more than once.  You have trouble breathing. Summary  Hypertension is another name for high blood pressure.  Making healthy choices can help lower blood pressure. If your blood pressure cannot be controlled with healthy choices, you may need to take medicine. This information is not intended to replace advice given to you by your health care provider. Make sure you discuss any questions you have with your health care provider. Document Released: 11/30/2007 Document Revised: 05/11/2016 Document Reviewed: 05/11/2016 Elsevier Interactive Patient Education  [employer].

## 2017-10-24 NOTE — Transfer of Care (Signed)
Immediate Anesthesia Transfer of Care Note  Patient: Glenn Coleman  Procedure(s) Performed: COLONOSCOPY WITH PROPOFOL (N/A ) HEMORRHOID BANDING (N/A ) POLYPECTOMY  Patient Location: PACU  Anesthesia Type:MAC  Level of Consciousness: awake, alert , oriented and patient cooperative  Airway & Oxygen Therapy: Patient Spontanous Breathing and Patient connected to nasal cannula oxygen  Post-op Assessment: Report given to RN and Post -op Vital signs reviewed and stable  Post vital signs: Reviewed and stable  Last Vitals:  Vitals Value Taken Time  BP    Temp    Pulse    Resp    SpO2      Last Pain:  Vitals:   10/24/17 0804  PainSc: 0-No pain      Patients Stated Pain Goal: 10 (00/45/99 7741)  Complications: No apparent anesthesia complications

## 2017-10-24 NOTE — Anesthesia Postprocedure Evaluation (Signed)
Anesthesia Post Note  Patient: Glenn Coleman  Procedure(s) Performed: COLONOSCOPY WITH PROPOFOL (N/A ) HEMORRHOID BANDING (N/A ) POLYPECTOMY  Patient location during evaluation: PACU Anesthesia Type: MAC Level of consciousness: awake and alert Pain management: pain level controlled Vital Signs Assessment: post-procedure vital signs reviewed and stable Respiratory status: spontaneous breathing, nonlabored ventilation, respiratory function stable and patient connected to nasal cannula oxygen Cardiovascular status: stable Postop Assessment: no apparent nausea or vomiting Anesthetic complications: no     Last Vitals:  Vitals:   10/24/17 0820 10/24/17 0830  BP: (!) 175/119 (!) 174/113  Pulse: 96 82  Resp: 14 11  Temp:    SpO2: 100% 99%    Last Pain:  Vitals:   10/24/17 0807  PainSc: 0-No pain                 Benay Pike

## 2017-10-24 NOTE — Anesthesia Preprocedure Evaluation (Signed)
Anesthesia Evaluation  Patient identified by MRN, date of birth, ID band Patient awake    Reviewed: Allergy & Precautions, H&P , NPO status , Patient's Chart, lab work & pertinent test results, reviewed documented beta blocker date and time   Airway Mallampati: II  TM Distance: >3 FB Neck ROM: full    Dental no notable dental hx.    Pulmonary neg pulmonary ROS, Current Smoker,    Pulmonary exam normal breath sounds clear to auscultation       Cardiovascular Exercise Tolerance: Good negative cardio ROS Normal cardiovascular exam Rhythm:regular Rate:Normal     Neuro/Psych negative neurological ROS  negative psych ROS   GI/Hepatic negative GI ROS, Neg liver ROS,   Endo/Other  negative endocrine ROS  Renal/GU negative Renal ROS  negative genitourinary   Musculoskeletal   Abdominal   Peds  Hematology negative hematology ROS (+)   Anesthesia Other Findings No clinical complaints  Reproductive/Obstetrics negative OB ROS                             Anesthesia Physical Anesthesia Plan  ASA: II  Anesthesia Plan: MAC   Post-op Pain Management:    Induction:   PONV Risk Score and Plan:   Airway Management Planned:   Additional Equipment:   Intra-op Plan:   Post-operative Plan:   Informed Consent: I have reviewed the patients History and Physical, chart, labs and discussed the procedure including the risks, benefits and alternatives for the proposed anesthesia with the patient or authorized representative who has indicated his/her understanding and acceptance.   Dental Advisory Given  Plan Discussed with: CRNA  Anesthesia Plan Comments:         Anesthesia Quick Evaluation

## 2017-10-24 NOTE — Progress Notes (Signed)
PT is aware.

## 2017-10-26 ENCOUNTER — Encounter (HOSPITAL_COMMUNITY): Payer: Self-pay | Admitting: Gastroenterology

## 2017-11-02 NOTE — Progress Notes (Signed)
On recall and scheduled

## 2017-11-02 NOTE — Progress Notes (Signed)
PT is aware and will let us know if he continues to have problems.

## 2017-12-04 ENCOUNTER — Ambulatory Visit: Payer: BLUE CROSS/BLUE SHIELD | Admitting: Nurse Practitioner

## 2017-12-04 ENCOUNTER — Encounter: Payer: Self-pay | Admitting: Nurse Practitioner

## 2017-12-04 VITALS — BP 168/94 | HR 73 | Temp 98.3°F | Ht 72.0 in | Wt 199.8 lb

## 2017-12-04 DIAGNOSIS — K649 Unspecified hemorrhoids: Secondary | ICD-10-CM

## 2017-12-04 DIAGNOSIS — K625 Hemorrhage of anus and rectum: Secondary | ICD-10-CM

## 2017-12-04 DIAGNOSIS — K5900 Constipation, unspecified: Secondary | ICD-10-CM | POA: Diagnosis not present

## 2017-12-04 DIAGNOSIS — K219 Gastro-esophageal reflux disease without esophagitis: Secondary | ICD-10-CM | POA: Diagnosis not present

## 2017-12-04 NOTE — Assessment & Plan Note (Signed)
Chronic GERD currently doing well on Nexium.  Continue current PPI, follow-up in 1 year.

## 2017-12-04 NOTE — Assessment & Plan Note (Signed)
Had some minimal post procedure bleeding.  Otherwise, no rectal bleeding since his colonoscopy.  Recommend he continue his current medications including Anusol as needed.  Continue bowel regimen to prevent constipation but also prevent diarrhea.  Follow-up in 1 year.  Call with any questions, concerns, problems.

## 2017-12-04 NOTE — Assessment & Plan Note (Signed)
Constipation doing well on stool softeners.  He has not needed MiraLAX in some time.  Recommend he continue his bowel regimen to prevent constipation but also avoid significant diarrhea.  Call with any questions.  Follow-up in 1 year.

## 2017-12-04 NOTE — Progress Notes (Signed)
CC'D TO PCP °

## 2017-12-04 NOTE — Assessment & Plan Note (Signed)
No further hemorrhoid symptoms since intra-colonoscopy banding.  Discussed possible need in the future of repeat banding's.  Hemorrhoid symptoms well controlled.  Still has Anusol cream if he needs it.  Request refills as needed.  Follow-up in 1 year.

## 2017-12-04 NOTE — Patient Instructions (Signed)
1. Continue your current medications. 2. Let us know if you have any worsening constipation. 3. Follow-up in 1 year. 4. Call us if you have any questions or concerns.  At [facility] we value your feedback. You may receive a survey about your visit today. Please share your experience as we strive to create trusting relationships with our patients to provide genuine, compassionate, quality care.  It was good to see you today!  I hope you have a great summer!!

## 2017-12-04 NOTE — Progress Notes (Signed)
Referring Provider: Glenda Chroman, MD Primary Care Physician:  Glenda Chroman, MD Primary GI:  Dr. Oneida Alar  Chief Complaint  Patient presents with  . Rectal Bleeding    no longer an issues  . Hemorrhoids    doing ok  . Constipation    BM's are QD    HPI:   Glenn Coleman is a 47 y.o. male who presents for follow-up on rectal bleeding and hemorrhoids.  The patient was last seen in our office 08/31/2017 for the same as well as constipation.  History of hemorrhoids and constipation.  At his last visit he stated his hemorrhoids have been bothering him intermittently "for some time".  He was still using Anusol which helps some but began having rectal bleeding over the previous year.  Most recent episode was a day prior to his visit with dripping blood, blood on the stool, and blood in the commode.  His bleeding is intermittent.  He does have straining and hard stools although taking stool softeners usually helps.  Takes this once a day.  No other GI symptoms.  He was requesting hemorrhoid banding if he is a candidate.  Recommended colonoscopy +/- hemorrhoid banding, continue Anusol, take Colace daily, increase water intake, MiraLAX 1-2 times a day as needed, follow-up in 3 months.  His colonoscopy was completed 10/24/2017 which found two 1 to 2 mm sessile polyps in the sigmoid and transverse colon, a single 6 mm polyp in the sigmoid colon which is semi-pedunculated, redundant rectosigmoid and sigmoid colon, 3 moderate size internal hemorrhoids status post hemorrhoid banding.  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  Recommended repeat colonoscopy in 5 years, consider flexible sigmoidoscopy with hemorrhoid banding if rectal bleeding not completely resolved, high-fiber diet, follow-up in 4 months.   Today he states he's doing well overall. Denies any further rectal bleeding since colonoscopy. Denies any further hemorrhoids symptoms. Still takes a stool softener 1-2 times a day, no  needing MiraLAX any more. On this regimen, his stools are consistent with [place] 4-5. Denies hematochezia, melena, abdominal pain, N/V, acute changes in bowel habits. GERD symptoms managed well on Nexium, denies dysphagia. Denies chest pain, dyspnea, dizziness, lightheadedness, syncope, near syncope. Denies any other upper or lower GI symptoms.  Past Medical History:  Diagnosis Date  . Anxiety   . Constipation   . GERD (gastroesophageal reflux disease)   . Hemorrhoids     Past Surgical History:  Procedure Laterality Date  . COLONOSCOPY WITH PROPOFOL N/A 10/24/2017   Procedure: COLONOSCOPY WITH PROPOFOL;  Surgeon: Danie Binder, MD;  Location: [facility];  Service: Endoscopy;  Laterality: N/A;  1:45pm-pt notified to arrive at 6:15am  . HEMORRHOID BANDING N/A 10/24/2017   Procedure: Thayer Jew;  Surgeon: Danie Binder, MD;  Location: [facility];  Service: Endoscopy;  Laterality: N/A;  . None to date     As of 08/31/17  . POLYPECTOMY  10/24/2017   Procedure: POLYPECTOMY;  Surgeon: Danie Binder, MD;  Location: [facility];  Service: Endoscopy;;  transverse colon polyp, sigmoid colon polyps x2    Current Outpatient Medications  Medication Sig Dispense Refill  . Coenzyme Q10 (COQ10) 100 MG CAPS Take 100 mg by mouth daily.    Marland Kitchen docusate sodium (COLACE) 100 MG capsule Take 100 mg by mouth daily as needed for mild constipation.     Marland Kitchen esomeprazole (NEXIUM) 40 MG capsule Take 40 mg by mouth daily as needed (acid reflux).    Marland Kitchen  hydrocortisone (ANUSOL-HC) 25 MG suppository Place 1 suppository (25 mg total) rectally 2 (two) times daily. (Patient taking differently: Place 25 mg rectally as needed. ) 12 suppository 1  . hydrocortisone-pramoxine (ANALPRAM-HC) 2.5-1 % rectal cream Place 1 application rectally daily as needed for hemorrhoids.   1  . ibuprofen (ADVIL,MOTRIN) 200 MG tablet Take 200 mg by mouth daily as needed for headache or moderate pain.    . Omega-3 Fatty Acids (FISH  OIL) 1200 MG CAPS Take 1,200 mg by mouth daily.    . polyethylene glycol (MIRALAX / GLYCOLAX) packet Take 17 g by mouth daily as needed for moderate constipation.     No current facility-administered medications for this visit.     Allergies as of 12/04/2017  . (No Known Allergies)    Family History  Problem Relation Age of Onset  . Colon cancer Neg Hx     Social History   Socioeconomic History  . Marital status: Married    Spouse name: Not on file  . Number of children: Not on file  . Years of education: Not on file  . Highest education level: Not on file  Occupational History  . Not on file  Social Needs  . Financial resource strain: Not on file  . Food insecurity:    Worry: Not on file    Inability: Not on file  . Transportation needs:    Medical: Not on file    Non-medical: Not on file  Tobacco Use  . Smoking status: Current Some Day Smoker    Packs/day: 0.00    Types: Cigars  . Smokeless tobacco: Never Used  . Tobacco comment: 1 cigar  Substance and Sexual Activity  . Alcohol use: Yes    Alcohol/week: 0.0 oz    Comment: Every weekend, about 3-5 drinks  . Drug use: No  . Sexual activity: Not on file  Lifestyle  . Physical activity:    Days per week: Not on file    Minutes per session: Not on file  . Stress: Not on file  Relationships  . Social connections:    Talks on phone: Not on file    Gets together: Not on file    Attends religious service: Not on file    Active member of club or organization: Not on file    Attends meetings of clubs or organizations: Not on file    Relationship status: Not on file  Other Topics Concern  . Not on file  Social History Narrative  . Not on file    Review of Systems: Complete ROS negative except as per HPI.   Physical Exam: BP (!) 168/94   Pulse 73   Temp 98.3 F (36.8 C) (Oral)   Ht 6' (1.829 m)   Wt 199 lb 12.8 oz (90.6 kg)   BMI 27.10 kg/m  General:   Alert and oriented. Pleasant and cooperative.  Well-nourished and well-developed.  Eyes:  Without icterus, sclera clear and conjunctiva pink.  Ears:  Normal auditory acuity. Cardiovascular:  S1, S2 present with 0-6/3 systolic murmur noted. Extremities without clubbing or edema. Respiratory:  Clear to auscultation bilaterally. No wheezes, rales, or rhonchi. No distress.  Gastrointestinal:  +BS, soft, non-tender and non-distended. No HSM noted. No guarding or rebound. No masses appreciated.  Rectal:  Deferred  Musculoskalatal:  Symmetrical without gross deformities. Neurologic:  Alert and oriented x4;  grossly normal neurologically. Psych:  Alert and cooperative. Normal mood and affect.    12/04/2017 9:46 AM  Disclaimer: This note was dictated with voice recognition software. Similar sounding words can inadvertently be transcribed and may not be corrected upon review.

## 2018-10-22 NOTE — Progress Notes (Signed)
REVIEWED-NO ADDITIONAL RECOMMENDATIONS. 

## 2018-10-23 NOTE — Progress Notes (Signed)
REVIEWED-NO ADDITIONAL RECOMMENDATIONS. 

## 2018-12-03 ENCOUNTER — Encounter: Payer: Self-pay | Admitting: Gastroenterology

## 2020-09-01 ENCOUNTER — Encounter: Payer: Self-pay | Admitting: Internal Medicine

## 2022-10-06 ENCOUNTER — Encounter: Payer: Self-pay | Admitting: *Deleted
